# Patient Record
Sex: Male | Born: 1940 | Race: Black or African American | Hispanic: No | Marital: Married | Smoking: Former smoker
Health system: Southern US, Community
[De-identification: ages and names within clinical notes are randomized; demographics above are authoritative.]

## PROBLEM LIST (undated history)

## (undated) DIAGNOSIS — E1143 Type 2 diabetes mellitus with diabetic autonomic (poly)neuropathy: Secondary | ICD-10-CM

## (undated) DIAGNOSIS — I10 Essential (primary) hypertension: Secondary | ICD-10-CM

## (undated) DIAGNOSIS — E785 Hyperlipidemia, unspecified: Secondary | ICD-10-CM

## (undated) HISTORY — DX: Hyperlipidemia, unspecified: E78.5

## (undated) HISTORY — PX: HERNIA REPAIR: SHX51

## (undated) HISTORY — DX: Essential (primary) hypertension: I10

---
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
## 2009-01-11 ENCOUNTER — Ambulatory Visit (HOSPITAL_COMMUNITY): Admission: RE | Admit: 2009-01-11 | Discharge: 2009-01-11 | Payer: Self-pay | Admitting: Ophthalmology

## 2011-03-21 LAB — COMPREHENSIVE METABOLIC PANEL
ALT: 19 U/L (ref 0–53)
Alkaline Phosphatase: 40 U/L (ref 39–117)
CO2: 25 mEq/L (ref 19–32)
Chloride: 101 mEq/L (ref 96–112)
GFR calc non Af Amer: 30 mL/min — ABNORMAL LOW (ref >60)
Glucose, Bld: 144 mg/dL — ABNORMAL HIGH (ref 70–99)
Potassium: 3.9 mEq/L (ref 3.5–5.1)
Sodium: 135 mEq/L (ref 135–145)
Total Bilirubin: 0.5 mg/dL (ref 0.3–1.2)

## 2011-03-21 LAB — GLUCOSE, CAPILLARY: Glucose-Capillary: 155 mg/dL — ABNORMAL HIGH (ref 70–99)

## 2011-03-21 LAB — URINALYSIS, ROUTINE W REFLEX MICROSCOPIC
Bilirubin Urine: NEGATIVE
Ketones, ur: NEGATIVE mg/dL
Nitrite: NEGATIVE
Protein, ur: NEGATIVE mg/dL

## 2011-03-21 LAB — CBC
Hemoglobin: 11.4 g/dL — ABNORMAL LOW (ref 13.0–17.0)
MCHC: 34.1 g/dL (ref 30.0–36.0)
RBC: 3.71 MIL/uL — ABNORMAL LOW (ref 4.22–5.81)
WBC: 8.5 10*3/uL (ref 4.0–10.5)

## 2011-04-18 NOTE — Op Note (Signed)
NAME:  Christopher Wolfe, Christopher Wolfe                 ACCOUNT NO.:  [id number]   MEDICAL RECORD NO.:  [id number]          PATIENT TYPE:  AMB   LOCATION:  SDS                          FACILITY:  [facility]   PHYSICIAN:  Lanna Poche, M.D. 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   PROCEDURE:  Partial vitrectomy, peeling of internal limiting membrane,  and supplemental panretinal photocoagulation, right eye.   SURGEON:  Lanna Poche, MD   ANESTHESIA:  Local with monitored anesthesia care.   ESTIMATED BLOOD LOSS:  Less than 1 mL.   COMPLICATIONS:  None.   OPERATIVE NOTE:  The patient was taken the operating room.  After local  IV sedation, a retrobulbar injection of 50:50 mixture of 4% lidocaine  and 0.75% Marcaine was given around the right eye for volume of 8 mL.  Adequate akinesia and anesthesia was obtained.  The right eye was then  prepped and draped in the usual fashion.  Lid speculum was introduced  and conjunctival peritomy was developed  supratemporally and  superonasally.  Hemostasis was obtained with laser cautery and the  sclerotomies were fashioned 3 mm posterior limbus at 1:30, 10:30, and  7:30.  Superior sclerotomies were plugged and a 4-mm infusion cannula  was secured to the 7:30 sclerotomy with temporary sutures of 7-0 Vicryl.  The tip was visually inspected and found to be in good position.  Landers ring was secured to globe with a 7-0 Vicryl sutures at 3 and 9.  The plugs were removed, and 30-degree prismatic lens was applied on the  surface of eye.  Central core followed by peripheral vitrectomy was  performed.  There was posterior hyaloid separation.  Scleral depression  was used to further debulk the hemorrhagic inferior vitreous base.  Magnifying flat lens was then applied to the surface of eye.  Inspection  of macula showed the glistening surface of the retina with an evidence  of a small cyst in the center.  The internal membrane was then elected  to be peeled by making a small incision to the membrane superior,  proximal and distal away from the fovea.  The membrane was then elevated  off surface of the Rice pick and removed in 2 separate pieces with DORC  forceps.  The instruments from eyes and holes plugged.  The Landers lens  was removed.  Inspection with the ophthalmoscope and scleral depression  revealed there to be no peripheral retinal breaks or tears.  The  indirect laser was used to fill in small gaps in pan photocoagulation  primarily inferiorly.  Superior sclerotomies were then closed with 7-0  Vicryl.  Infusion cannula was removed and preplaced suture secured.  Conjunctiva was drawn and reapproximated with interrupted running  sutures of 6-0 plain gut.  Pressure checked with Barraquer tonometer and  found to lay at 21.  The subconjunctival space was irrigated with 0.75%  Marcaine, followed by subconjunctival injection of 100 mg  ceftazidime and 10 mg of Decadron.  Lid speculum was then removed and  mixed antibiotic ointment was applied to the surface of eye.  An eye  patch and shield were then placed over the patient's right eye, and the  patient left the operating room in stable condition.            ______________________________  Lanna Poche, M.D.     JTH/MEDQ  D:  01/11/2009  T:  01/12/2009  Job:  [id number]

## 2011-10-30 ENCOUNTER — Ambulatory Visit (INDEPENDENT_AMBULATORY_CARE_PROVIDER_SITE_OTHER): Payer: Medicare Other | Admitting: Ophthalmology

## 2011-10-30 DIAGNOSIS — H43819 Vitreous degeneration, unspecified eye: Secondary | ICD-10-CM

## 2011-10-30 DIAGNOSIS — E11359 Type 2 diabetes mellitus with proliferative diabetic retinopathy without macular edema: Secondary | ICD-10-CM

## 2011-10-30 DIAGNOSIS — E1139 Type 2 diabetes mellitus with other diabetic ophthalmic complication: Secondary | ICD-10-CM

## 2011-10-30 DIAGNOSIS — E1165 Type 2 diabetes mellitus with hyperglycemia: Secondary | ICD-10-CM

## 2012-04-30 ENCOUNTER — Ambulatory Visit (INDEPENDENT_AMBULATORY_CARE_PROVIDER_SITE_OTHER): Payer: Medicare Other | Admitting: Ophthalmology

## 2012-04-30 DIAGNOSIS — E1139 Type 2 diabetes mellitus with other diabetic ophthalmic complication: Secondary | ICD-10-CM

## 2012-04-30 DIAGNOSIS — E11359 Type 2 diabetes mellitus with proliferative diabetic retinopathy without macular edema: Secondary | ICD-10-CM

## 2012-04-30 DIAGNOSIS — H43819 Vitreous degeneration, unspecified eye: Secondary | ICD-10-CM

## 2012-04-30 DIAGNOSIS — H3581 Retinal edema: Secondary | ICD-10-CM

## 2012-09-02 ENCOUNTER — Ambulatory Visit (INDEPENDENT_AMBULATORY_CARE_PROVIDER_SITE_OTHER): Payer: Medicare Other | Admitting: Ophthalmology

## 2012-09-02 DIAGNOSIS — H35039 Hypertensive retinopathy, unspecified eye: Secondary | ICD-10-CM

## 2012-09-02 DIAGNOSIS — H43819 Vitreous degeneration, unspecified eye: Secondary | ICD-10-CM

## 2012-09-02 DIAGNOSIS — E11359 Type 2 diabetes mellitus with proliferative diabetic retinopathy without macular edema: Secondary | ICD-10-CM

## 2012-09-02 DIAGNOSIS — I10 Essential (primary) hypertension: Secondary | ICD-10-CM

## 2012-09-02 DIAGNOSIS — E1139 Type 2 diabetes mellitus with other diabetic ophthalmic complication: Secondary | ICD-10-CM

## 2012-09-12 ENCOUNTER — Encounter (INDEPENDENT_AMBULATORY_CARE_PROVIDER_SITE_OTHER): Payer: Medicare Other | Admitting: Ophthalmology

## 2012-09-12 DIAGNOSIS — H2700 Aphakia, unspecified eye: Secondary | ICD-10-CM

## 2013-03-13 ENCOUNTER — Ambulatory Visit (INDEPENDENT_AMBULATORY_CARE_PROVIDER_SITE_OTHER): Payer: Medicare Other | Admitting: Ophthalmology

## 2013-03-13 DIAGNOSIS — E1165 Type 2 diabetes mellitus with hyperglycemia: Secondary | ICD-10-CM

## 2013-03-13 DIAGNOSIS — H35039 Hypertensive retinopathy, unspecified eye: Secondary | ICD-10-CM

## 2013-03-13 DIAGNOSIS — E11359 Type 2 diabetes mellitus with proliferative diabetic retinopathy without macular edema: Secondary | ICD-10-CM

## 2013-03-13 DIAGNOSIS — I10 Essential (primary) hypertension: Secondary | ICD-10-CM

## 2013-03-13 DIAGNOSIS — H43819 Vitreous degeneration, unspecified eye: Secondary | ICD-10-CM

## 2013-03-13 DIAGNOSIS — E1139 Type 2 diabetes mellitus with other diabetic ophthalmic complication: Secondary | ICD-10-CM

## 2013-04-29 ENCOUNTER — Ambulatory Visit (INDEPENDENT_AMBULATORY_CARE_PROVIDER_SITE_OTHER): Payer: Medicare Other | Admitting: Podiatry

## 2013-04-29 ENCOUNTER — Encounter: Payer: Self-pay | Admitting: Podiatry

## 2013-04-29 DIAGNOSIS — B351 Tinea unguium: Secondary | ICD-10-CM | POA: Insufficient documentation

## 2013-04-29 DIAGNOSIS — M25579 Pain in unspecified ankle and joints of unspecified foot: Secondary | ICD-10-CM | POA: Insufficient documentation

## 2013-04-29 NOTE — Progress Notes (Signed)
Subjective: 72 y.o. year old male patient presents complaining of painful nails. Patient requests toe nails trimmed. Stated that his blood sugar level is ok.   Patient Summary List & History reviewed for allergies, medications, medical problems and surgical history.  Objective: Dermatologic:  Thick dystrophic nails x 10. No skin lesions. Vascular: Right DP and PT are not palpable.  Left DP and PT are palpable.  No edema or trophic changes. Orthopedic:  Contracted lesser digits bilateral. Neurologic:  Failed to respond to Monofilament sensory testing.  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   Treatment: All mycotic nails debrided.

## 2013-07-30 ENCOUNTER — Encounter: Payer: Self-pay | Admitting: Podiatry

## 2013-07-30 ENCOUNTER — Ambulatory Visit (INDEPENDENT_AMBULATORY_CARE_PROVIDER_SITE_OTHER): Payer: Medicare Other | Admitting: Podiatry

## 2013-07-30 VITALS — BP 141/60 | HR 58

## 2013-07-30 DIAGNOSIS — E1149 Type 2 diabetes mellitus with other diabetic neurological complication: Secondary | ICD-10-CM

## 2013-07-30 DIAGNOSIS — M25579 Pain in unspecified ankle and joints of unspecified foot: Secondary | ICD-10-CM

## 2013-07-30 DIAGNOSIS — B351 Tinea unguium: Secondary | ICD-10-CM

## 2013-07-30 NOTE — Progress Notes (Signed)
Subjective: 72 y.o. year old male patient presents complaining of painful nails. Patient requests toe nails trimmed. Reports no new problems.  Stated that blood sugar was 79 this morning before breakfast. Usually stays under 110.   Patient Summary List & History reviewed for allergies, medications, medical problems and surgical history.  Objective: Dermatologic:  Thick yellow and hypertrophic nails x 10. Vascular: Pedal pulses are all palpable. No edema or erythema noted. Orthopedic:  Rectus foot without gross deformities. Neurologic:  Gait normal. Reflexes normal and symmetric. Sensation grossly normal  Assessment: Dystrophic mycotic nails x 10.  Treatment: All mycotic nails debrided.  Return in 3 months or as needed.

## 2013-07-30 NOTE — Patient Instructions (Addendum)
Diabetic foot care and toe nails trimmed. Return as needed or in 3 months for palliative foot care.

## 2013-09-15 ENCOUNTER — Ambulatory Visit (INDEPENDENT_AMBULATORY_CARE_PROVIDER_SITE_OTHER): Payer: Medicare Other | Admitting: Ophthalmology

## 2013-09-15 DIAGNOSIS — E1139 Type 2 diabetes mellitus with other diabetic ophthalmic complication: Secondary | ICD-10-CM

## 2013-09-15 DIAGNOSIS — I10 Essential (primary) hypertension: Secondary | ICD-10-CM

## 2013-09-15 DIAGNOSIS — H35039 Hypertensive retinopathy, unspecified eye: Secondary | ICD-10-CM

## 2013-09-15 DIAGNOSIS — H43819 Vitreous degeneration, unspecified eye: Secondary | ICD-10-CM

## 2013-09-15 DIAGNOSIS — E11359 Type 2 diabetes mellitus with proliferative diabetic retinopathy without macular edema: Secondary | ICD-10-CM

## 2013-09-24 ENCOUNTER — Other Ambulatory Visit: Payer: Self-pay | Admitting: Gastroenterology

## 2013-09-24 ENCOUNTER — Ambulatory Visit
Admission: RE | Admit: 2013-09-24 | Discharge: 2013-09-24 | Disposition: A | Discharge status: Home / Self Care | Payer: Medicare Other | Source: Ambulatory Visit | Attending: Gastroenterology | Admitting: Gastroenterology

## 2013-09-24 DIAGNOSIS — Z9889 Other specified postprocedural states: Secondary | ICD-10-CM

## 2013-11-04 ENCOUNTER — Encounter: Payer: Self-pay | Admitting: Podiatry

## 2013-11-04 ENCOUNTER — Ambulatory Visit (INDEPENDENT_AMBULATORY_CARE_PROVIDER_SITE_OTHER): Payer: Medicare Other | Admitting: Podiatry

## 2013-11-04 VITALS — BP 119/73 | HR 60

## 2013-11-04 DIAGNOSIS — M25579 Pain in unspecified ankle and joints of unspecified foot: Secondary | ICD-10-CM

## 2013-11-04 DIAGNOSIS — B351 Tinea unguium: Secondary | ICD-10-CM

## 2013-11-04 NOTE — Progress Notes (Signed)
Subjective:  72 y.o. year old male patient presents complaining of painful nails. They are thick and hard, hurts in shoes.  Patient requests toe nails trimmed. Reports no new problems.  Stated that blood sugar runs fairly normal recently.   Objective: Dermatologic:  Thick yellow and hypertrophic nails x 10.  Vascular:  Pedal pulses are all palpable. No edema or erythema noted.  Orthopedic:  Severe contracted lesser digits bilateral. Neurologic:  All epicritic and tactile sensations are grossly normal  Assessment:  Dystrophic mycotic nails x 10.  Painful feet. Treatment: All mycotic nails debrided.  Return in 3 months or as needed.

## 2013-11-04 NOTE — Patient Instructions (Signed)
Seen for hypertrophic nails x 10. All nails debrided. Return in 3 months.

## 2014-02-03 ENCOUNTER — Encounter: Payer: Self-pay | Admitting: Podiatry

## 2014-02-03 ENCOUNTER — Ambulatory Visit (INDEPENDENT_AMBULATORY_CARE_PROVIDER_SITE_OTHER): Payer: Medicare Other | Admitting: Podiatry

## 2014-02-03 VITALS — BP 141/64 | HR 59

## 2014-02-03 DIAGNOSIS — M25579 Pain in unspecified ankle and joints of unspecified foot: Secondary | ICD-10-CM

## 2014-02-03 DIAGNOSIS — B351 Tinea unguium: Secondary | ICD-10-CM

## 2014-02-03 DIAGNOSIS — E119 Type 2 diabetes mellitus without complications: Secondary | ICD-10-CM

## 2014-02-03 DIAGNOSIS — M79606 Pain in leg, unspecified: Secondary | ICD-10-CM

## 2014-02-03 DIAGNOSIS — M79609 Pain in unspecified limb: Secondary | ICD-10-CM

## 2014-02-03 NOTE — Progress Notes (Signed)
Subjective:  73 y.o. year old male patient presents complaining of painful nails. They are thick and hard, hurts in shoes. Stated that blood sugar runs fairly normal recently.   Objective: Dermatologic:  Thick yellow and hypertrophic nails x 10.  Vascular:  Pedal pulses are not palpable during this visit. No edema or erythema noted.  No ischemic changes seen. Orthopedic:  Severe contracted lesser digits bilateral.  Neurologic:  All epicritic and tactile sensations are grossly normal.  Assessment:  Dystrophic mycotic nails x 10.  Painful feet.   Treatment: All mycotic nails debrided.  Return in 3 months or as needed.

## 2014-02-03 NOTE — Patient Instructions (Signed)
Seen for hypertrophic nails. All nails debrided. Return in 3 months or as needed.  

## 2014-03-20 ENCOUNTER — Ambulatory Visit (INDEPENDENT_AMBULATORY_CARE_PROVIDER_SITE_OTHER): Payer: Medicare Other | Admitting: Ophthalmology

## 2014-03-20 DIAGNOSIS — E1165 Type 2 diabetes mellitus with hyperglycemia: Secondary | ICD-10-CM

## 2014-03-20 DIAGNOSIS — H35039 Hypertensive retinopathy, unspecified eye: Secondary | ICD-10-CM

## 2014-03-20 DIAGNOSIS — I10 Essential (primary) hypertension: Secondary | ICD-10-CM

## 2014-03-20 DIAGNOSIS — E11359 Type 2 diabetes mellitus with proliferative diabetic retinopathy without macular edema: Secondary | ICD-10-CM

## 2014-03-20 DIAGNOSIS — E1139 Type 2 diabetes mellitus with other diabetic ophthalmic complication: Secondary | ICD-10-CM

## 2014-03-20 DIAGNOSIS — H43819 Vitreous degeneration, unspecified eye: Secondary | ICD-10-CM

## 2014-05-06 ENCOUNTER — Encounter: Payer: Self-pay | Admitting: Podiatry

## 2014-05-06 ENCOUNTER — Ambulatory Visit (INDEPENDENT_AMBULATORY_CARE_PROVIDER_SITE_OTHER): Payer: Medicare Other | Admitting: Podiatry

## 2014-05-06 VITALS — BP 159/69 | HR 55

## 2014-05-06 DIAGNOSIS — M79609 Pain in unspecified limb: Secondary | ICD-10-CM

## 2014-05-06 DIAGNOSIS — M79606 Pain in leg, unspecified: Secondary | ICD-10-CM

## 2014-05-06 DIAGNOSIS — B351 Tinea unguium: Secondary | ICD-10-CM

## 2014-05-06 NOTE — Progress Notes (Signed)
Subjective: 73 y.o. year old male patient presents complaining of painful nails. Patient requests toe nails trimmed. Patient is diabetic and his blood sugar is going up and down lately, ranging 120-140.  Objective: Dermatologic:  Thick discolored and dystrophic nails x 10. Vascular: Right pedal pulses are not palpable. Left foot DP is palpable.  Orthopedic:  Multiple digital contracture 2-5 R>L.  Neurologic:  All epicritic and tactile sensations grossly intact.   Assessment: Dystrophic mycotic nails x 10. NIDDM with fluctuating blood sugar.   Treatment: All mycotic nails debrided.  Return in 3 month or sooner as needed.

## 2014-05-06 NOTE — Patient Instructions (Signed)
Seen for hypertrophic nails. All nails debrided. Return in 3 months or as needed.  

## 2014-08-07 ENCOUNTER — Encounter: Payer: Self-pay | Admitting: Podiatry

## 2014-08-07 ENCOUNTER — Ambulatory Visit (INDEPENDENT_AMBULATORY_CARE_PROVIDER_SITE_OTHER): Payer: Medicare Other | Admitting: Podiatry

## 2014-08-07 VITALS — BP 153/57 | HR 59

## 2014-08-07 DIAGNOSIS — M79606 Pain in leg, unspecified: Secondary | ICD-10-CM

## 2014-08-07 DIAGNOSIS — B351 Tinea unguium: Secondary | ICD-10-CM

## 2014-08-07 DIAGNOSIS — M79609 Pain in unspecified limb: Secondary | ICD-10-CM

## 2014-08-07 NOTE — Patient Instructions (Signed)
Seen for hypertrophic nails. All nails debrided. Return in 3 months or as needed.  

## 2014-08-07 NOTE — Progress Notes (Signed)
Subjective:  73 y.o. year old male patient presents complaining of painful nails. Patient requests toe nails trimmed.   Objective: Dermatologic:  Thick discolored and dystrophic nails x 10.  Vascular:  Right pedal pulses are not palpable. Left foot DP is palpable.  Orthopedic:  Multiple digital contracture 2-5 R>L.  Neurologic:  All epicritic and tactile sensations grossly intact.   Assessment:  Dystrophic mycotic nails x 10.  NIDDM under control.    Plan: All nails debrided. Return in 3 months.

## 2014-09-23 ENCOUNTER — Ambulatory Visit (INDEPENDENT_AMBULATORY_CARE_PROVIDER_SITE_OTHER): Payer: Medicare Other | Admitting: Ophthalmology

## 2014-09-23 DIAGNOSIS — E11351 Type 2 diabetes mellitus with proliferative diabetic retinopathy with macular edema: Secondary | ICD-10-CM

## 2014-09-23 DIAGNOSIS — H43813 Vitreous degeneration, bilateral: Secondary | ICD-10-CM

## 2014-09-23 DIAGNOSIS — H35372 Puckering of macula, left eye: Secondary | ICD-10-CM

## 2014-09-23 DIAGNOSIS — E11311 Type 2 diabetes mellitus with unspecified diabetic retinopathy with macular edema: Secondary | ICD-10-CM

## 2014-09-23 DIAGNOSIS — I10 Essential (primary) hypertension: Secondary | ICD-10-CM

## 2014-11-03 ENCOUNTER — Ambulatory Visit (INDEPENDENT_AMBULATORY_CARE_PROVIDER_SITE_OTHER): Payer: Medicare Other | Admitting: Podiatry

## 2014-11-03 ENCOUNTER — Encounter: Payer: Self-pay | Admitting: Podiatry

## 2014-11-03 VITALS — BP 165/62 | HR 59 | Ht 74.0 in | Wt 225.0 lb

## 2014-11-03 DIAGNOSIS — M79606 Pain in leg, unspecified: Secondary | ICD-10-CM

## 2014-11-03 DIAGNOSIS — B351 Tinea unguium: Secondary | ICD-10-CM

## 2014-11-03 NOTE — Patient Instructions (Signed)
Seen for hypertrophic nails. All nails debrided. Return in 3 months or as needed.  

## 2014-11-03 NOTE — Progress Notes (Signed)
Subjective:  73 y.o. year old male patient presents complaining of painful nails. Patient requests toe nails trimmed.  No new problems reported. Blood sugar is under control. Last three mornings read between 105-107. Blood pressure is up just now, was normal at home.   Objective: Dermatologic:  Thick discolored and dystrophic nails x 10.  Vascular:  Right pedal pulses are not palpable. Left foot DP is palpable.  Orthopedic:  Multiple digital contracture 2-5 R>L.  Neurologic:  All epicritic and tactile sensations grossly intact.   Assessment:  Dystrophic mycotic nails x 10.  NIDDM under control.   Plan: All nails debrided. Return in 3 months.

## 2014-11-06 ENCOUNTER — Ambulatory Visit: Payer: Medicare Other | Admitting: Podiatry

## 2015-02-02 ENCOUNTER — Encounter: Payer: Self-pay | Admitting: Podiatry

## 2015-02-02 ENCOUNTER — Ambulatory Visit (INDEPENDENT_AMBULATORY_CARE_PROVIDER_SITE_OTHER): Payer: Medicare Other | Admitting: Podiatry

## 2015-02-02 VITALS — BP 153/56 | HR 59

## 2015-02-02 DIAGNOSIS — M79606 Pain in leg, unspecified: Secondary | ICD-10-CM

## 2015-02-02 DIAGNOSIS — B351 Tinea unguium: Secondary | ICD-10-CM | POA: Diagnosis not present

## 2015-02-02 NOTE — Patient Instructions (Signed)
Seen for hypertrophic nails. All nails debrided. Return in 3 months or as needed.  

## 2015-02-02 NOTE — Progress Notes (Signed)
Subjective:  74 y.o. year old male patient presents complaining of painful nails. Patient requests toe nails trimmed.   Objective: Dermatologic:  Thick discolored and dystrophic nails x 10.  Vascular:  Right pedal pulses are not palpable. Left foot DP is palpable.  Orthopedic:  Multiple digital contracture 2-5 R>L.  Neurologic:  All epicritic and tactile sensations grossly intact.   Assessment:  Dystrophic mycotic nails x 10.  NIDDM under control.   Plan: All nails debrided. Return in 3 months.

## 2015-03-29 ENCOUNTER — Ambulatory Visit (INDEPENDENT_AMBULATORY_CARE_PROVIDER_SITE_OTHER): Payer: Medicare Other | Admitting: Ophthalmology

## 2015-03-29 DIAGNOSIS — E11359 Type 2 diabetes mellitus with proliferative diabetic retinopathy without macular edema: Secondary | ICD-10-CM | POA: Diagnosis not present

## 2015-03-29 DIAGNOSIS — E11319 Type 2 diabetes mellitus with unspecified diabetic retinopathy without macular edema: Secondary | ICD-10-CM

## 2015-03-29 DIAGNOSIS — H35372 Puckering of macula, left eye: Secondary | ICD-10-CM | POA: Diagnosis not present

## 2015-03-29 DIAGNOSIS — I10 Essential (primary) hypertension: Secondary | ICD-10-CM | POA: Diagnosis not present

## 2015-03-29 DIAGNOSIS — H35033 Hypertensive retinopathy, bilateral: Secondary | ICD-10-CM | POA: Diagnosis not present

## 2015-03-29 DIAGNOSIS — H43813 Vitreous degeneration, bilateral: Secondary | ICD-10-CM

## 2015-05-05 ENCOUNTER — Encounter: Payer: Self-pay | Admitting: Podiatry

## 2015-05-05 ENCOUNTER — Ambulatory Visit (INDEPENDENT_AMBULATORY_CARE_PROVIDER_SITE_OTHER): Payer: Medicare Other | Admitting: Podiatry

## 2015-05-05 VITALS — BP 160/63 | HR 58

## 2015-05-05 DIAGNOSIS — B351 Tinea unguium: Secondary | ICD-10-CM

## 2015-05-05 DIAGNOSIS — M79606 Pain in leg, unspecified: Secondary | ICD-10-CM

## 2015-05-05 NOTE — Progress Notes (Signed)
Subjective:  74 y.o. year old male patient presents complaining of painful nails. Patient requests toe nails trimmed.  No new problems.   Objective: Dermatologic:  Thick discolored and dystrophic nails x 10.  Vascular:  Right pedal pulses are not palpable. Left foot DP is palpable.  Orthopedic:  Multiple digital contracture 2-5 R>L.  Neurologic:  All epicritic and tactile sensations grossly intact.   Assessment:  Dystrophic mycotic nails x 10.  NIDDM under control.   Plan: All nails debrided. Return in 3 months.

## 2015-05-05 NOTE — Patient Instructions (Signed)
Seen for hypertrophic nails. All nails debrided. Return in 3 months or as needed.  

## 2015-08-06 ENCOUNTER — Encounter: Payer: Self-pay | Admitting: Podiatry

## 2015-08-06 ENCOUNTER — Ambulatory Visit (INDEPENDENT_AMBULATORY_CARE_PROVIDER_SITE_OTHER): Payer: Medicare Other | Admitting: Podiatry

## 2015-08-06 VITALS — BP 175/63 | HR 67

## 2015-08-06 DIAGNOSIS — M79606 Pain in leg, unspecified: Secondary | ICD-10-CM | POA: Diagnosis not present

## 2015-08-06 DIAGNOSIS — B351 Tinea unguium: Secondary | ICD-10-CM

## 2015-08-06 NOTE — Patient Instructions (Signed)
Seen for hypertrophic nails. All nails debrided. Return in 3 months or as needed.  

## 2015-08-06 NOTE — Progress Notes (Signed)
Subjective:  74 y.o. year old male patient presents complaining of painful nails. Patient requests toe nails trimmed.  Denies any new problems.   Objective: Dermatologic:  Thick discolored and dystrophic nails x 10.  Vascular:  Right pedal pulses are not palpable. Left foot DP is palpable.  Orthopedic:  Multiple digital contracture 2-5 R>L.  Neurologic:  All epicritic and tactile sensations grossly intact.   Assessment:  Dystrophic mycotic nails x 10.  NIDDM under control.   Plan: All nails debrided. Return in 3 months or as needed.

## 2015-09-28 ENCOUNTER — Ambulatory Visit (INDEPENDENT_AMBULATORY_CARE_PROVIDER_SITE_OTHER): Payer: Medicare Other | Admitting: Ophthalmology

## 2015-09-28 DIAGNOSIS — E11311 Type 2 diabetes mellitus with unspecified diabetic retinopathy with macular edema: Secondary | ICD-10-CM | POA: Diagnosis not present

## 2015-09-28 DIAGNOSIS — H35033 Hypertensive retinopathy, bilateral: Secondary | ICD-10-CM

## 2015-09-28 DIAGNOSIS — H43813 Vitreous degeneration, bilateral: Secondary | ICD-10-CM | POA: Diagnosis not present

## 2015-09-28 DIAGNOSIS — I10 Essential (primary) hypertension: Secondary | ICD-10-CM

## 2015-09-28 DIAGNOSIS — E113512 Type 2 diabetes mellitus with proliferative diabetic retinopathy with macular edema, left eye: Secondary | ICD-10-CM | POA: Diagnosis not present

## 2015-09-28 DIAGNOSIS — E113591 Type 2 diabetes mellitus with proliferative diabetic retinopathy without macular edema, right eye: Secondary | ICD-10-CM

## 2015-10-13 ENCOUNTER — Other Ambulatory Visit (INDEPENDENT_AMBULATORY_CARE_PROVIDER_SITE_OTHER): Payer: Medicare Other | Admitting: Ophthalmology

## 2015-10-13 DIAGNOSIS — H26492 Other secondary cataract, left eye: Secondary | ICD-10-CM | POA: Diagnosis not present

## 2015-10-21 ENCOUNTER — Ambulatory Visit (INDEPENDENT_AMBULATORY_CARE_PROVIDER_SITE_OTHER): Payer: Medicare Other | Admitting: Ophthalmology

## 2015-10-26 ENCOUNTER — Ambulatory Visit (INDEPENDENT_AMBULATORY_CARE_PROVIDER_SITE_OTHER): Payer: Medicare Other | Admitting: Ophthalmology

## 2015-10-26 DIAGNOSIS — H2702 Aphakia, left eye: Secondary | ICD-10-CM

## 2015-10-27 ENCOUNTER — Ambulatory Visit (INDEPENDENT_AMBULATORY_CARE_PROVIDER_SITE_OTHER): Payer: Medicare Other | Admitting: Ophthalmology

## 2015-11-04 ENCOUNTER — Encounter: Payer: Self-pay | Admitting: Podiatry

## 2015-11-04 ENCOUNTER — Ambulatory Visit (INDEPENDENT_AMBULATORY_CARE_PROVIDER_SITE_OTHER): Payer: Medicare Other | Admitting: Podiatry

## 2015-11-04 VITALS — BP 160/57 | HR 51

## 2015-11-04 DIAGNOSIS — M79606 Pain in leg, unspecified: Secondary | ICD-10-CM

## 2015-11-04 DIAGNOSIS — B351 Tinea unguium: Secondary | ICD-10-CM | POA: Diagnosis not present

## 2015-11-04 NOTE — Progress Notes (Signed)
Subjective:  74 y.o. year old male patient presents complaining of painful nails. Patient requests toe nails trimmed.  Denies any new problems.   Objective: Dermatologic:  Thick discolored and dystrophic nails x 10.  Deformed thick nail with dry blood at distal end under callused skin 3rd right.  Vascular:  Right pedal pulses are not palpable. Left foot DP is palpable.  Orthopedic:  Multiple digital contracture 2-5 R>L.  Neurologic:  All epicritic and tactile sensations grossly intact.   Assessment:  Dystrophic mycotic nails x 10.  NIDDM under control.   Plan: All nails debrided. Return in 3 months or as needed.

## 2015-11-04 NOTE — Patient Instructions (Signed)
Seen for hypertrophic nails. All nails debrided. Return in 3 months or as needed.  

## 2015-11-05 ENCOUNTER — Ambulatory Visit: Payer: Medicare Other | Admitting: Podiatry

## 2016-02-02 ENCOUNTER — Ambulatory Visit: Payer: Medicare Other | Admitting: Podiatry

## 2016-02-09 ENCOUNTER — Encounter: Payer: Self-pay | Admitting: Podiatry

## 2016-02-09 ENCOUNTER — Ambulatory Visit (INDEPENDENT_AMBULATORY_CARE_PROVIDER_SITE_OTHER): Payer: Medicare Other | Admitting: Podiatry

## 2016-02-09 VITALS — BP 151/57 | HR 55

## 2016-02-09 DIAGNOSIS — M79606 Pain in leg, unspecified: Secondary | ICD-10-CM

## 2016-02-09 DIAGNOSIS — B351 Tinea unguium: Secondary | ICD-10-CM | POA: Diagnosis not present

## 2016-02-09 NOTE — Progress Notes (Signed)
Subjective:  75 y.o. year old male patient presents complaining of painful nails. Patient requests toe nails trimmed.  Denies any new problems.   Objective: Dermatologic:  Thick discolored and dystrophic nails x 10.  Deformed thick nail with dry blood at distal end under callused skin 3rd right.  Vascular:  Right pedal pulses are not palpable. Left foot DP is palpable. Orthopedic:  Multiple digital contracture 2-5 R>L.  Neurologic:  All epicritic and tactile sensations grossly intact.   Assessment:  Dystrophic mycotic nails x 10.  NIDDM under control.   Plan: All nails debrided. Bleeding nails cauterized with silver nitrate sticks. Return in 3 months or as needed.

## 2016-02-09 NOTE — Patient Instructions (Signed)
Seen for hypertrophic nails. All nails debrided. Return in 3 months or as needed.  

## 2016-04-24 ENCOUNTER — Ambulatory Visit (INDEPENDENT_AMBULATORY_CARE_PROVIDER_SITE_OTHER): Payer: Medicare Other | Admitting: Ophthalmology

## 2016-04-24 DIAGNOSIS — E11319 Type 2 diabetes mellitus with unspecified diabetic retinopathy without macular edema: Secondary | ICD-10-CM | POA: Diagnosis not present

## 2016-04-24 DIAGNOSIS — E113593 Type 2 diabetes mellitus with proliferative diabetic retinopathy without macular edema, bilateral: Secondary | ICD-10-CM | POA: Diagnosis not present

## 2016-04-24 DIAGNOSIS — H35033 Hypertensive retinopathy, bilateral: Secondary | ICD-10-CM

## 2016-04-24 DIAGNOSIS — I10 Essential (primary) hypertension: Secondary | ICD-10-CM

## 2016-04-24 DIAGNOSIS — H43813 Vitreous degeneration, bilateral: Secondary | ICD-10-CM | POA: Diagnosis not present

## 2016-05-11 ENCOUNTER — Encounter: Payer: Self-pay | Admitting: Podiatry

## 2016-05-11 ENCOUNTER — Ambulatory Visit (INDEPENDENT_AMBULATORY_CARE_PROVIDER_SITE_OTHER): Payer: Medicare Other | Admitting: Podiatry

## 2016-05-11 VITALS — BP 165/60 | HR 60

## 2016-05-11 DIAGNOSIS — B351 Tinea unguium: Secondary | ICD-10-CM

## 2016-05-11 DIAGNOSIS — M79606 Pain in leg, unspecified: Secondary | ICD-10-CM | POA: Diagnosis not present

## 2016-05-11 NOTE — Patient Instructions (Signed)
Seen for hypertrophic nails. All nails corns debrided. Bleeding area cleansed and Amerigel compression dressing applied. Keep it covered for 2 days.  Return in 3 months or as needed.

## 2016-05-11 NOTE — Progress Notes (Signed)
Subjective:  75 y.o. year old male patient presents complaining of painful nails. Patient requests toe nails trimmed.  Denies any new problems.   Objective: Dermatologic:  Thick discolored and dystrophic nails x 10.  Deformed thick nail with dry blood at distal end under callused skin 3rd right.  Vascular:  Right pedal pulses are not palpable. Left foot DP is palpable. Orthopedic:  Multiple digital contracture 2-5 R>L.  Neurologic:  All epicritic and tactile sensations grossly intact.   Assessment:  Dystrophic mycotic nails x 10.  NIDDM under control.   Plan: All nails debrided. Bleeding nails cauterized with silver nitrate sticks. Return in 3 months or as needed.

## 2016-08-15 ENCOUNTER — Ambulatory Visit (INDEPENDENT_AMBULATORY_CARE_PROVIDER_SITE_OTHER): Payer: Medicare Other | Admitting: Podiatry

## 2016-08-15 ENCOUNTER — Encounter: Payer: Self-pay | Admitting: Podiatry

## 2016-08-15 DIAGNOSIS — B351 Tinea unguium: Secondary | ICD-10-CM

## 2016-08-15 DIAGNOSIS — M79606 Pain in leg, unspecified: Secondary | ICD-10-CM

## 2016-08-15 DIAGNOSIS — M79673 Pain in unspecified foot: Secondary | ICD-10-CM | POA: Diagnosis not present

## 2016-08-15 NOTE — Patient Instructions (Signed)
Seen for hypertrophic nails and calluses All nails debrided. Thick bled callus 3rd digit right debrided. Return in 3 months or sooner if needed.

## 2016-08-15 NOTE — Progress Notes (Signed)
Subjective:  75 y.o. year old male patient presents complaining of painful nails and calluses. Patient requests toe nails trimmed.   Objective: Dermatologic:  Thick discolored and dystrophic nails x 10.  Deformed thick nail with dry blood at distal end under callused skin 3rd right.  Vascular:  Right pedal pulses are not palpable. Left foot DP is palpable. Orthopedic:  Multiple digital contracture 2-5 R>L.  Neurologic:  All epicritic and tactile sensations grossly intact.   Assessment:  Dystrophic mycotic nails x 10.  NIDDM under control.   Plan: All nails debrided. Return in 3 months or as needed.

## 2016-10-25 ENCOUNTER — Ambulatory Visit (INDEPENDENT_AMBULATORY_CARE_PROVIDER_SITE_OTHER): Payer: Medicare Other | Admitting: Ophthalmology

## 2016-10-25 DIAGNOSIS — E11311 Type 2 diabetes mellitus with unspecified diabetic retinopathy with macular edema: Secondary | ICD-10-CM

## 2016-10-25 DIAGNOSIS — E113513 Type 2 diabetes mellitus with proliferative diabetic retinopathy with macular edema, bilateral: Secondary | ICD-10-CM

## 2016-10-25 DIAGNOSIS — H35033 Hypertensive retinopathy, bilateral: Secondary | ICD-10-CM

## 2016-10-25 DIAGNOSIS — H43813 Vitreous degeneration, bilateral: Secondary | ICD-10-CM | POA: Diagnosis not present

## 2016-10-25 DIAGNOSIS — I10 Essential (primary) hypertension: Secondary | ICD-10-CM

## 2016-11-14 ENCOUNTER — Encounter: Payer: Self-pay | Admitting: Podiatry

## 2016-11-14 ENCOUNTER — Ambulatory Visit (INDEPENDENT_AMBULATORY_CARE_PROVIDER_SITE_OTHER): Payer: Medicare Other | Admitting: Podiatry

## 2016-11-14 DIAGNOSIS — B351 Tinea unguium: Secondary | ICD-10-CM | POA: Diagnosis not present

## 2016-11-14 DIAGNOSIS — M79673 Pain in unspecified foot: Secondary | ICD-10-CM | POA: Diagnosis not present

## 2016-11-14 DIAGNOSIS — L97511 Non-pressure chronic ulcer of other part of right foot limited to breakdown of skin: Secondary | ICD-10-CM | POA: Diagnosis not present

## 2016-11-14 NOTE — Progress Notes (Signed)
Subjective: 75 y.o. year old male patient presents complaining of painful nails and calluses. Patient requests toe nails trimmed.  Blood sugar is under control stays in 90's.  Objective: Dermatologic: Thick discolored and dystrophic nails x 10.  Deformed thick nail with ulcerated callused skin 3rd digit at distal end right.  Vascular: Right pedal pulses are not palpable. Left foot DP is palpable. Orthopedic: Multiple digital contracture 2-5 R>L.  Neurologic: All epicritic and tactile sensations grossly intact.   Assessment: Dystrophic mycotic nails x 10.  Ulcer 3rd digit right. NIDDM under control.   Plan: All nails debrided. Ulcer debrided and Amerigel dressing applied. Buttress pad placed under 2nd and 3rd digit right. Return in 3 weeks for follow up on 3rd digit right.

## 2016-11-14 NOTE — Patient Instructions (Addendum)
Seen for hypertrophic nails. Noted of ulcerating corn at distal end 3rd digit right. All nails and ulcerating corn debrided. Covered with Amerigel dressing and placed Buttress pad under 2nd and 3rd digit right. Keep the 2nd toe lesion covered after each shower. Use buttress pad while ambulating. Return in 3 weeks.

## 2016-12-05 ENCOUNTER — Ambulatory Visit (INDEPENDENT_AMBULATORY_CARE_PROVIDER_SITE_OTHER): Payer: Medicare Other | Admitting: Podiatry

## 2016-12-05 ENCOUNTER — Encounter: Payer: Self-pay | Admitting: Podiatry

## 2016-12-05 DIAGNOSIS — L97511 Non-pressure chronic ulcer of other part of right foot limited to breakdown of skin: Secondary | ICD-10-CM | POA: Diagnosis not present

## 2016-12-05 DIAGNOSIS — M79671 Pain in right foot: Secondary | ICD-10-CM | POA: Diagnosis not present

## 2016-12-05 DIAGNOSIS — M2041 Other hammer toe(s) (acquired), right foot: Secondary | ICD-10-CM | POA: Diagnosis not present

## 2016-12-05 DIAGNOSIS — M79673 Pain in unspecified foot: Secondary | ICD-10-CM

## 2016-12-05 DIAGNOSIS — M79672 Pain in left foot: Secondary | ICD-10-CM | POA: Diagnosis not present

## 2016-12-05 NOTE — Patient Instructions (Signed)
Seen for hypertrophic nails. Noted of ulcerating corn on 3rd digit right. Debrided and padded with buttress pad. All nails debrided. Return in 1 month for follow up on right 3rd toe ulcer.

## 2016-12-05 NOTE — Progress Notes (Signed)
Subjective: 76y.o. year old male patient presents complaining of painful nails and calluses. Patient requests toe nails trimmed.  Blood sugar is under control.  Objective: Dermatologic: Thick discolored and dystrophic nails x 10.  Ulcerated callused skin 3rd digit at distal end right.  Vascular: Right pedal pulses are not palpable. Left foot DP is palpable.  Orthopedic: Multiple digital contracture 2-5 R>L.  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 Neurologic: All epicritic and tactile sensations grossly intact.   Assessment: Dystrophic mycotic nails x 10.  Ulcer 3rd digit right. NIDDM under control.   Plan: Debrided ulcerated callus and dressing applied. All nails debrided. Reviewed possible surgical intervention to prevent on going ulceration.  Amerigel dressing applied. Buttress pad placed under 3rd digit right. Return in 1 month follow up on 3rd digit right.

## 2017-01-04 ENCOUNTER — Ambulatory Visit: Payer: Medicare Other | Admitting: Podiatry

## 2017-01-09 ENCOUNTER — Ambulatory Visit (INDEPENDENT_AMBULATORY_CARE_PROVIDER_SITE_OTHER): Payer: Medicare Other | Admitting: Podiatry

## 2017-01-09 DIAGNOSIS — M79672 Pain in left foot: Secondary | ICD-10-CM

## 2017-01-09 DIAGNOSIS — M79671 Pain in right foot: Secondary | ICD-10-CM | POA: Diagnosis not present

## 2017-01-09 DIAGNOSIS — L97511 Non-pressure chronic ulcer of other part of right foot limited to breakdown of skin: Secondary | ICD-10-CM

## 2017-01-09 DIAGNOSIS — M2041 Other hammer toe(s) (acquired), right foot: Secondary | ICD-10-CM

## 2017-01-09 DIAGNOSIS — B351 Tinea unguium: Secondary | ICD-10-CM

## 2017-01-09 NOTE — Progress Notes (Signed)
Subjective: 76y.o. year old male patient presents complaining of painful nails and calluses. Patient requests toe nails trimmed.  Blood sugar is under control.   Objective: Dermatologic: Thick discolored and dystrophic nails x 10.  Ulceratedcallused skin 3rd digit at distal end with raw tissue exposure right.  Vascular: Right pedal pulses are not palpable. Left foot DP is palpable.  Orthopedic: Multiple digital contracture 2-5 R>L.  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 Neurologic: All epicritic and tactile sensations grossly intact.   Assessment: Dystrophic mycotic nails x 10.  Ulcer 3rd digit right. NIDDM under control.   Plan: Debrided ulcerated callus and dressing applied. All nails debrided. Reviewed possible surgical intervention to prevent on going ulceration.  Amerigel dressing applied. Buttress pad placed under 3rd digit right. Return in 1 week for follow up on 3rd digit right.

## 2017-01-09 NOTE — Patient Instructions (Signed)
Seen for painful toe. All nails are hypertrophic.  3rd digit right foot has ulcerated lesion at distal end. Nail and infected tissue removed and dressing applied to 3rd digit right. Need daily soaking in Epson salt water and re dress with antibiotic ointment and gauze. All other nails debrided. Return in 1 week.

## 2017-01-10 ENCOUNTER — Encounter: Payer: Self-pay | Admitting: Podiatry

## 2017-01-16 ENCOUNTER — Ambulatory Visit: Payer: Medicare Other | Admitting: Podiatry

## 2017-01-17 ENCOUNTER — Ambulatory Visit (INDEPENDENT_AMBULATORY_CARE_PROVIDER_SITE_OTHER): Payer: Medicare Other | Admitting: Podiatry

## 2017-01-17 ENCOUNTER — Encounter: Payer: Self-pay | Admitting: Podiatry

## 2017-01-17 DIAGNOSIS — L97511 Non-pressure chronic ulcer of other part of right foot limited to breakdown of skin: Secondary | ICD-10-CM

## 2017-01-17 DIAGNOSIS — M2041 Other hammer toe(s) (acquired), right foot: Secondary | ICD-10-CM | POA: Diagnosis not present

## 2017-01-17 DIAGNOSIS — M79671 Pain in right foot: Secondary | ICD-10-CM

## 2017-01-17 NOTE — Progress Notes (Signed)
Subjective: 76y.o. year old male patient presents for follow up on right 3rd toe ulcer. Blood sugar is under control.   Objective: Dermatologic: Ulcer surrounded by thickcallused skin 3rd digit at distal end, 0.8 cm in diameter with raw tissue exposure right. No active drainage noted. Vascular: Right pedal pulses are not palpable. Left foot DP is palpable.  Orthopedic: Multiple digital contracture 2-5 R>L.  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 Neurologic: All epicritic and tactile sensations grossly intact.   Assessment: Ulcer at distal end 3rd digit right no change since the last visit. NIDDM under control.   Plan: Debrided ulcerated callus and dressing applied. Amerigel dressing applied. Buttress pad placed under 2nd and 3rd digit right. Replacement pad dispensed. Return in 1 week forfollow up on 3rd digit right.

## 2017-01-17 NOTE — Patient Instructions (Signed)
Follow up on ulcer 3rd digit right. Still has opening that has not improved since last visit. Need be off of feet more. Debrided and padded. Keep the pad during the day and remove at night. Return in one week.

## 2017-01-18 ENCOUNTER — Encounter (INDEPENDENT_AMBULATORY_CARE_PROVIDER_SITE_OTHER): Payer: Medicare Other | Admitting: Ophthalmology

## 2017-01-18 DIAGNOSIS — E113591 Type 2 diabetes mellitus with proliferative diabetic retinopathy without macular edema, right eye: Secondary | ICD-10-CM | POA: Diagnosis not present

## 2017-01-18 DIAGNOSIS — E113512 Type 2 diabetes mellitus with proliferative diabetic retinopathy with macular edema, left eye: Secondary | ICD-10-CM | POA: Diagnosis not present

## 2017-01-18 DIAGNOSIS — E11311 Type 2 diabetes mellitus with unspecified diabetic retinopathy with macular edema: Secondary | ICD-10-CM | POA: Diagnosis not present

## 2017-01-18 DIAGNOSIS — H43813 Vitreous degeneration, bilateral: Secondary | ICD-10-CM

## 2017-01-18 DIAGNOSIS — I10 Essential (primary) hypertension: Secondary | ICD-10-CM | POA: Diagnosis not present

## 2017-01-18 DIAGNOSIS — H35033 Hypertensive retinopathy, bilateral: Secondary | ICD-10-CM

## 2017-01-24 ENCOUNTER — Encounter: Payer: Self-pay | Admitting: Podiatry

## 2017-01-24 ENCOUNTER — Ambulatory Visit (INDEPENDENT_AMBULATORY_CARE_PROVIDER_SITE_OTHER): Payer: Medicare Other | Admitting: Podiatry

## 2017-01-24 DIAGNOSIS — L97511 Non-pressure chronic ulcer of other part of right foot limited to breakdown of skin: Secondary | ICD-10-CM

## 2017-01-24 DIAGNOSIS — M79671 Pain in right foot: Secondary | ICD-10-CM

## 2017-01-24 DIAGNOSIS — M2041 Other hammer toe(s) (acquired), right foot: Secondary | ICD-10-CM | POA: Diagnosis not present

## 2017-01-24 NOTE — Patient Instructions (Signed)
Follow up on right foot ulcer. Noted that still much pressure to the toe from weight bearing. Debrided, padded, placed in surgical shoe. Stay off of feet and wear surgical shoe to get around. Return in one week.

## 2017-01-24 NOTE — Progress Notes (Signed)
Subjective: 76y.o. year old male patient presents for follow up on right 3rd toe ulcer. Blood sugar is under control.   Objective: Dermatologic: Ulcer surrounded by thickcallused skin 3rd digit at distal end, 0.8 cm in diameter with raw tissue exposure right. No active drainage noted. Vascular: Right pedal pulses are not palpable. Left foot DP is palpable.  Orthopedic: Multiple digital contracture 2-5 R>L.  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 Neurologic: All epicritic and tactile sensations grossly intact.   Assessment: Ulcer at distal end 3rd digit right, slow improvement, 0.5 cm diameter in open center. NIDDM under control.   Plan: Debrided ulcerated callus and dressing applied. Amerigel dressing applied. Removable pad placed under 3rd digit right.  Placed in surgical shoe. Return in 1 week forfollow up on 3rd digit right.

## 2017-01-31 ENCOUNTER — Ambulatory Visit (INDEPENDENT_AMBULATORY_CARE_PROVIDER_SITE_OTHER): Payer: Medicare Other | Admitting: Podiatry

## 2017-01-31 ENCOUNTER — Encounter: Payer: Self-pay | Admitting: Podiatry

## 2017-01-31 DIAGNOSIS — M2041 Other hammer toe(s) (acquired), right foot: Secondary | ICD-10-CM | POA: Diagnosis not present

## 2017-01-31 DIAGNOSIS — I739 Peripheral vascular disease, unspecified: Secondary | ICD-10-CM | POA: Diagnosis not present

## 2017-01-31 DIAGNOSIS — L97511 Non-pressure chronic ulcer of other part of right foot limited to breakdown of skin: Secondary | ICD-10-CM | POA: Diagnosis not present

## 2017-01-31 NOTE — Progress Notes (Signed)
Subjective: 76y.o. year old male patient presents for 1 week follow up on right 3rd toe ulcer. Patient is accompanied by his wife. Patient made a new claim during this visit saying that the practitioner made a cut and cause the sore toe.Marland Kitchen.  He is on surgical shoe wearing old removable pad that was dispensed last week.  Stated that he has been off of feet more than before and changed dressing every other day.  Objective: Dermatologic: Ulcer site is clean without further callus build up.  The lesion has improved with superficial open base, 0.2 cm at distal end.  No active drainage noted. Vascular: Right pedal pulses are not palpable. Left foot DP is palpable.  Mild forefoot edema present at lateral aspect. Orthopedic: Multiple digital contracture 2-5 R>L.  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 Neurologic: All epicritic and tactile sensations grossly intact.   Assessment: Ulcer at distal end 3rd digit right made big improvement, down to 0.2 cm superficial open base without callus build up. Compromised peripheral vascularity right. NIDDM under control.   Plan: 3rd toe lesion cleansed with Iodine and Amerigel dressing applied. New removable pad placed under 3rd digit right with instruction.  Instructed to keep the pad during the day. May remove the pad at night to aerate more. Informed to wife, when the lesion was found and what was done for the last 3 weeks to resolve the pressure ulcer that was caused by contracted digit. Christopher Wolfe seems to understand the explannation.  [name] insisted that he did not cause the lesion but cause during the office when he came in to have toe nails trimmed. Explained to both the lesion is almost healed. It may heal off in 2 weeks if he continue to stay off and take load off of feet. Advised to return in 2 weeks for final follow up on the lesion. [name] refused to take advice to return and walked  out without making appointment.  Will send him a letter of recommendation, to seek medical care in next 2 weeks to be sure the toe is completely healed.

## 2017-01-31 NOTE — Patient Instructions (Signed)
Follow up right foot ulcer. Noted of much improved ulcer site 3rd toe right. Lesion cleansed and dressing re-aplied with Amerigel ointment. Keep it covered during the day. May leave it open at night. Return in two weeks.

## 2017-07-20 ENCOUNTER — Ambulatory Visit (INDEPENDENT_AMBULATORY_CARE_PROVIDER_SITE_OTHER): Payer: Medicare Other | Admitting: Ophthalmology

## 2017-07-27 ENCOUNTER — Ambulatory Visit (INDEPENDENT_AMBULATORY_CARE_PROVIDER_SITE_OTHER): Payer: Medicare Other | Admitting: Ophthalmology

## 2017-07-27 DIAGNOSIS — E113512 Type 2 diabetes mellitus with proliferative diabetic retinopathy with macular edema, left eye: Secondary | ICD-10-CM

## 2017-07-27 DIAGNOSIS — I10 Essential (primary) hypertension: Secondary | ICD-10-CM | POA: Diagnosis not present

## 2017-07-27 DIAGNOSIS — E113591 Type 2 diabetes mellitus with proliferative diabetic retinopathy without macular edema, right eye: Secondary | ICD-10-CM | POA: Diagnosis not present

## 2017-07-27 DIAGNOSIS — E11319 Type 2 diabetes mellitus with unspecified diabetic retinopathy without macular edema: Secondary | ICD-10-CM

## 2017-07-27 DIAGNOSIS — H35033 Hypertensive retinopathy, bilateral: Secondary | ICD-10-CM

## 2017-07-27 DIAGNOSIS — H43813 Vitreous degeneration, bilateral: Secondary | ICD-10-CM | POA: Diagnosis not present

## 2018-02-06 ENCOUNTER — Encounter (INDEPENDENT_AMBULATORY_CARE_PROVIDER_SITE_OTHER): Payer: Medicare Other | Admitting: Ophthalmology

## 2018-02-06 DIAGNOSIS — H43813 Vitreous degeneration, bilateral: Secondary | ICD-10-CM

## 2018-02-06 DIAGNOSIS — I10 Essential (primary) hypertension: Secondary | ICD-10-CM

## 2018-02-06 DIAGNOSIS — E11319 Type 2 diabetes mellitus with unspecified diabetic retinopathy without macular edema: Secondary | ICD-10-CM | POA: Diagnosis not present

## 2018-02-06 DIAGNOSIS — H35033 Hypertensive retinopathy, bilateral: Secondary | ICD-10-CM | POA: Diagnosis not present

## 2018-02-06 DIAGNOSIS — E113593 Type 2 diabetes mellitus with proliferative diabetic retinopathy without macular edema, bilateral: Secondary | ICD-10-CM

## 2018-08-14 ENCOUNTER — Encounter (INDEPENDENT_AMBULATORY_CARE_PROVIDER_SITE_OTHER): Payer: Medicare Other | Admitting: Ophthalmology

## 2018-08-15 ENCOUNTER — Encounter (INDEPENDENT_AMBULATORY_CARE_PROVIDER_SITE_OTHER): Payer: Medicare Other | Admitting: Ophthalmology

## 2018-08-15 DIAGNOSIS — H35033 Hypertensive retinopathy, bilateral: Secondary | ICD-10-CM

## 2018-08-15 DIAGNOSIS — I10 Essential (primary) hypertension: Secondary | ICD-10-CM

## 2018-08-15 DIAGNOSIS — E113513 Type 2 diabetes mellitus with proliferative diabetic retinopathy with macular edema, bilateral: Secondary | ICD-10-CM | POA: Diagnosis not present

## 2018-08-15 DIAGNOSIS — H43813 Vitreous degeneration, bilateral: Secondary | ICD-10-CM

## 2018-08-15 DIAGNOSIS — E11319 Type 2 diabetes mellitus with unspecified diabetic retinopathy without macular edema: Secondary | ICD-10-CM

## 2019-01-07 ENCOUNTER — Telehealth: Payer: Self-pay | Admitting: Cardiology

## 2019-01-20 NOTE — Telephone Encounter (Signed)
On 01/07/2019 I called patient to make appt but his wife said he does not need an appt.

## 2019-02-06 ENCOUNTER — Other Ambulatory Visit: Payer: Self-pay | Admitting: Gastroenterology

## 2019-02-06 DIAGNOSIS — R109 Unspecified abdominal pain: Secondary | ICD-10-CM

## 2019-02-10 ENCOUNTER — Other Ambulatory Visit: Payer: Self-pay | Admitting: Cardiology

## 2019-02-10 DIAGNOSIS — I714 Abdominal aortic aneurysm, without rupture, unspecified: Secondary | ICD-10-CM

## 2019-02-13 ENCOUNTER — Encounter (INDEPENDENT_AMBULATORY_CARE_PROVIDER_SITE_OTHER): Payer: Medicare Other | Admitting: Ophthalmology

## 2019-02-13 ENCOUNTER — Other Ambulatory Visit: Payer: Self-pay

## 2019-02-13 DIAGNOSIS — E113593 Type 2 diabetes mellitus with proliferative diabetic retinopathy without macular edema, bilateral: Secondary | ICD-10-CM | POA: Diagnosis not present

## 2019-02-13 DIAGNOSIS — I10 Essential (primary) hypertension: Secondary | ICD-10-CM

## 2019-02-13 DIAGNOSIS — E11319 Type 2 diabetes mellitus with unspecified diabetic retinopathy without macular edema: Secondary | ICD-10-CM

## 2019-02-13 DIAGNOSIS — H35033 Hypertensive retinopathy, bilateral: Secondary | ICD-10-CM

## 2019-02-13 DIAGNOSIS — H43813 Vitreous degeneration, bilateral: Secondary | ICD-10-CM

## 2019-02-14 ENCOUNTER — Other Ambulatory Visit: Payer: Self-pay | Admitting: Gastroenterology

## 2019-02-14 ENCOUNTER — Ambulatory Visit
Admission: RE | Admit: 2019-02-14 | Discharge: 2019-02-14 | Disposition: A | Discharge status: Home / Self Care | Payer: Medicare Other | Source: Ambulatory Visit | Attending: Gastroenterology | Admitting: Gastroenterology

## 2019-02-14 DIAGNOSIS — R109 Unspecified abdominal pain: Secondary | ICD-10-CM

## 2019-02-21 ENCOUNTER — Other Ambulatory Visit: Payer: Medicare Other

## 2019-05-05 ENCOUNTER — Ambulatory Visit (INDEPENDENT_AMBULATORY_CARE_PROVIDER_SITE_OTHER): Payer: Medicare Other

## 2019-05-05 ENCOUNTER — Other Ambulatory Visit: Payer: Self-pay

## 2019-05-05 DIAGNOSIS — I714 Abdominal aortic aneurysm, without rupture, unspecified: Secondary | ICD-10-CM

## 2019-05-12 ENCOUNTER — Ambulatory Visit: Payer: Medicare Other | Admitting: Cardiology

## 2019-05-14 ENCOUNTER — Ambulatory Visit (INDEPENDENT_AMBULATORY_CARE_PROVIDER_SITE_OTHER): Payer: Medicare Other | Admitting: Cardiology

## 2019-05-14 ENCOUNTER — Encounter: Payer: Self-pay | Admitting: Cardiology

## 2019-05-14 ENCOUNTER — Other Ambulatory Visit: Payer: Self-pay

## 2019-05-14 VITALS — BP 130/74 | HR 64 | Ht 74.0 in | Wt 197.2 lb

## 2019-05-14 DIAGNOSIS — I129 Hypertensive chronic kidney disease with stage 1 through stage 4 chronic kidney disease, or unspecified chronic kidney disease: Secondary | ICD-10-CM | POA: Diagnosis not present

## 2019-05-14 DIAGNOSIS — I951 Orthostatic hypotension: Secondary | ICD-10-CM | POA: Diagnosis not present

## 2019-05-14 DIAGNOSIS — N183 Chronic kidney disease, stage 3 (moderate): Secondary | ICD-10-CM

## 2019-05-14 DIAGNOSIS — E1143 Type 2 diabetes mellitus with diabetic autonomic (poly)neuropathy: Secondary | ICD-10-CM | POA: Diagnosis not present

## 2019-05-14 DIAGNOSIS — I714 Abdominal aortic aneurysm, without rupture, unspecified: Secondary | ICD-10-CM

## 2019-05-14 DIAGNOSIS — E1322 Other specified diabetes mellitus with diabetic chronic kidney disease: Secondary | ICD-10-CM

## 2019-05-14 DIAGNOSIS — E1365 Other specified diabetes mellitus with hyperglycemia: Secondary | ICD-10-CM

## 2019-05-14 DIAGNOSIS — R0989 Other specified symptoms and signs involving the circulatory and respiratory systems: Secondary | ICD-10-CM

## 2019-05-14 DIAGNOSIS — E1165 Type 2 diabetes mellitus with hyperglycemia: Secondary | ICD-10-CM

## 2019-05-14 DIAGNOSIS — I10 Essential (primary) hypertension: Secondary | ICD-10-CM

## 2019-05-14 DIAGNOSIS — E7800 Pure hypercholesterolemia, unspecified: Secondary | ICD-10-CM | POA: Insufficient documentation

## 2019-05-14 DIAGNOSIS — IMO0002 Reserved for concepts with insufficient information to code with codable children: Secondary | ICD-10-CM

## 2019-05-14 HISTORY — DX: Reserved for concepts with insufficient information to code with codable children: IMO0002

## 2019-05-14 HISTORY — DX: Other specified symptoms and signs involving the circulatory and respiratory systems: R09.89

## 2019-05-14 HISTORY — DX: Essential (primary) hypertension: I10

## 2019-05-14 HISTORY — DX: Type 2 diabetes mellitus with hyperglycemia: E11.65

## 2019-05-14 HISTORY — DX: Pure hypercholesterolemia, unspecified: E78.00

## 2019-05-14 NOTE — Progress Notes (Signed)
Primary Physician/Referring:  Caffie Wolfe, Karla, MD  Patient ID: Christopher CoderPaul E Heidinger Jr., male    DOB: [birth year], 78 y.o.   MRN: [id number]  Chief Complaint  Patient presents with  . Hypertension  . Follow-up    HPI: Christopher Coderaul E Mcparland Jr.  is a 78 y.o. male  with  hypertension, diabetes mellitus with autonomic neuropathy and orthostatic hypotension, small abdominal aortic aneurysm, hypertension and hyperlipidemia. He has had multiple episodes of syncope, unexplained in January 2018, September 2018, February 2019. No recurrence since then. Wife present. He remains asymptomatic today except for occasional dizziness.   Past Medical History:  Diagnosis Date  . Bruit of left carotid artery 05/14/2019  . HTN (hypertension) 05/14/2019  . Hyperlipidemia   . Hypertension   . Pure hypercholesterolemia 05/14/2019  . Uncontrolled diabetes mellitus with diabetic autonomic neuropathy (HCC) 05/14/2019    Past Surgical History:  Procedure Laterality Date  . HERNIA REPAIR      Social History   Socioeconomic History  . Marital status: Married    Spouse name: Not on file  . Number of children: 2  . Years of education: Not on file  . Highest education level: Not on file  Occupational History  . Not on file  Social Needs  . Financial resource strain: Not on file  . Food insecurity:    Worry: Not on file    Inability: Not on file  . Transportation needs:    Medical: Not on file    Non-medical: Not on file  Tobacco Use  . Smoking status: Former Smoker    Years: 5.00    Types: Cigarettes    Last attempt to quit: 1962    Years since quitting: 58.4  . Smokeless tobacco: Never Used  Substance and Sexual Activity  . Alcohol use: Yes    Alcohol/week: 0.0 standard drinks    Frequency: Never  . Drug use: Never  . Sexual activity: Not on file  Lifestyle  . Physical activity:    Days per week: Not on file    Minutes per session: Not on file  . Stress: Not on file  Relationships  . Social connections:     Talks on phone: Not on file    Gets together: Not on file    Attends religious service: Not on file    Active member of club or organization: Not on file    Attends meetings of clubs or organizations: Not on file    Relationship status: Not on file  . Intimate partner violence:    Fear of current or ex partner: Not on file    Emotionally abused: Not on file    Physically abused: Not on file    Forced sexual activity: Not on file  Other Topics Concern  . Not on file  Social History Narrative  . Not on file    Review of Systems  Constitution: Negative for chills, decreased appetite, malaise/fatigue and weight gain.  Cardiovascular: Negative for chest pain, dyspnea on exertion, leg swelling and syncope.  Endocrine: Negative for cold intolerance.  Hematologic/Lymphatic: Does not bruise/bleed easily.  Musculoskeletal: Negative for joint swelling.  Gastrointestinal: Negative for abdominal pain, anorexia, change in bowel habit, hematochezia and melena.  Neurological: Positive for dizziness (mild). Negative for headaches, light-headedness, loss of balance and seizures.  Psychiatric/Behavioral: Positive for memory loss. Negative for depression and substance abuse.  All other systems reviewed and are negative.     Objective  Blood pressure 130/74, pulse 64, height 6'  2" (1.88 m), weight 197 lb 3.2 oz (89.4 kg), SpO2 96 %. Body mass index is 25.32 kg/m.    Physical Exam  Constitutional: He appears well-developed and well-nourished. No distress.  HENT:  Head: Atraumatic.  Eyes: Conjunctivae are normal.  Neck: Neck supple. No JVD present. No thyromegaly present.  Cardiovascular: Normal rate, regular rhythm and normal heart sounds. Exam reveals no gallop.  No murmur heard. Pulses:      Carotid pulses are 2+ on the right side and 2+ on the left side.      Femoral pulses are 2+ on the right side and 2+ on the left side.      Popliteal pulses are 2+ on the right side and 2+ on the left  side.       Dorsalis pedis pulses are 1+ on the right side and 1+ on the left side.       Posterior tibial pulses are 2+ on the right side and 2+ on the left side.  Pulmonary/Chest: Effort normal and breath sounds normal.  Abdominal: Soft. Bowel sounds are normal.  Musculoskeletal: Normal range of motion.  Neurological: He is alert.  Skin: Skin is warm and dry.  Psychiatric: He has a normal mood and affect.   Radiology: No results found.  Laboratory examination:   CMP Latest Ref Rng & Units 01/11/2009  Glucose 70 - 99 mg/dL 098(J144(H)  BUN 6 - 23 mg/dL 19(J44(H)  Creatinine 0.4 - 1.5 mg/dL 4.78(G2.17(H)  Sodium 956135 - 213145 mEq/L 135  Potassium 3.5 - 5.1 mEq/L 3.9  Chloride 96 - 112 mEq/L 101  CO2 19 - 32 mEq/L 25  Calcium 8.4 - 10.5 mg/dL 9.0  Total Protein 6.0 - 8.3 g/dL 7.2  Total Bilirubin 0.3 - 1.2 mg/dL 0.5  Alkaline Phos 39 - 117 U/L 40  AST 0 - 37 U/L 24  ALT 0 - 53 U/L 19   CBC Latest Ref Rng & Units 01/11/2009  WBC 4.0 - 10.5 K/uL 8.5  Hemoglobin 13.0 - 17.0 g/dL 11.4(L)  Hematocrit 39.0 - 52.0 % 33.4(L)  Platelets 150 - 400 K/uL 200   Lipid Panel  No results found for: CHOL, TRIG, HDL, CHOLHDL, VLDL, LDLCALC, LDLDIRECT HEMOGLOBIN A1C No results found for: HGBA1C, MPG TSH No results for input(s): TSH in the last 8760 hours.  PRN Meds:. Medications Discontinued During This Encounter  Medication Reason  . COMBIGAN 0.2-0.5 % ophthalmic solution Error  . montelukast (SINGULAIR) 10 MG tablet Error  . MYRBETRIQ 50 MG TB24 tablet Error  . pindolol (VISKEN) 5 MG tablet Error  . TRAVATAN Z 0.004 % SOLN ophthalmic solution Error  . VESICARE 10 MG tablet Error  . losartan (COZAAR) 25 MG tablet Error  . hydrochlorothiazide (MICROZIDE) 12.5 MG capsule Error   Current Meds  Medication Sig  . amLODipine (NORVASC) 10 MG tablet Take 10 mg by mouth daily.   Marland Kitchen. aspirin EC 81 MG tablet Take 81 mg by mouth daily.  . Cholecalciferol (VITAMIN D-3) 25 MCG (1000 UT) CAPS Take by mouth daily.   . CRESTOR 10 MG tablet Take 10 mg by mouth daily.   Marland Kitchen. donepezil (ARICEPT) 10 MG tablet Take 20 mg by mouth at bedtime.  . dorzolamide (TRUSOPT) 2 % ophthalmic solution Place 1 drop into both eyes 2 (two) times daily.   . dorzolamide-timolol (COSOPT) 22.3-6.8 MG/ML ophthalmic solution   . glimepiride (AMARYL) 4 MG tablet Take 4 mg by mouth daily before breakfast.   . hydrALAZINE (APRESOLINE) 25 MG  tablet Take 25 mg by mouth 3 (three) times daily. Pt sometimes takes 2 a day  . LEVEMIR FLEXPEN 100 UNIT/ML SOPN 36 units Am, 26 PM  . Multiple Vitamin (MULTIVITAMIN WITH MINERALS) TABS tablet Take 1 tablet by mouth daily.  . Multiple Vitamins-Minerals (PRESERVISION AREDS 2 PO) Take by mouth 2 (two) times a day.  . omega-3 acid ethyl esters (LOVAZA) 1 g capsule Take by mouth 2 (two) times daily.  Marland Kitchen torsemide (DEMADEX) 20 MG tablet Take 20 mg by mouth daily.  . [DISCONTINUED] COMBIGAN 0.2-0.5 % ophthalmic solution Place 1 drop into both eyes every 12 (twelve) hours.   . [DISCONTINUED] hydrochlorothiazide (MICROZIDE) 12.5 MG capsule Take 12.5 mg by mouth 2 (two) times daily.   . [DISCONTINUED] losartan (COZAAR) 25 MG tablet Take 25 mg by mouth daily.     Cardiac Studies:    Abdominal Aortic Duplex  05/05/2019: Moderate dilatation of the abdominal aorta is noted in the distal aorta. An abdominal aortic aneurysm measuring 3.3 x 3.52 x 3.4 cm is seen. Diffuse calcific plaque noted in the proximal, mid and distal aorta.  no significant change since 06/28/2017.  Echocardiogram 10/05/2017: Left ventricle cavity is normal in size. Moderate concentric hypertrophy of the left ventricle. Normal global wall motion. Doppler evidence of grade II (pseudonormal) diastolic dysfunction. Diastolic dysfunction findings suggests elevated LA/LV end diastolic pressure. Calculated EF 61%. Left atrial cavity is moderately dilated at 4.6 cm. Mild (Grade I) mitral regurgitation. Trace tricuspid regurgitation. Unable to  estimate PA pressure due to absence/minimal TR signal.  Exercise myoview stress 10/01/2017:  1. Exercise tolerance was fair. Heart rate response was adequate. Blood pressure response was hypertensive. Patient reached 95% of target heart raate and achieved 6.4 METS. . Stress EKG Test Results Normal. Clinically negative stress test with no chest pain.  2. Normal SPECT study with normal left ventricular systolic function. Low risk study. Fixed perfusion defect due to diaphragmatic attenuation. Normal left ventricular systolic functio  ABI (lifeline screening 06/24/2015: Normal bilateral 1.03.  Carotid artery duplex 10/04/2017: Minimal stenosis in the biateral internal carotid artery (minimal). Antegrade right vertebral artery flow. Antegrade left vertebral artery flow. No significant change in 07/30/2014.  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 No evidence of ischemia.  Recommendations:   Patient remains stable from AAA surveillance, we can repeat abdominal aortic duplex in 2 years.  Except for uncontrolled diabetes, his risk factors are well taken care of including hypertension and hyperlipidemia.  Dizziness and orthostasis symptoms are improved since last office visit, he is also aware to get up slowly.  He has not had any recent fall.  Ordered cognition and mild dementia continues to be a problem here his wife is present in all questions answered.  I'll see him back in 2 years as his  remains stable from cardiac standpoint unless he develops any new cardiac symptoms we will see sooner.  Adrian Prows, MD, [facility] 05/14/2019, 9:21 PM [facility] [place] Pager: [phone] Office: [phone] If no answer Cell [phone]

## 2019-08-18 ENCOUNTER — Encounter (INDEPENDENT_AMBULATORY_CARE_PROVIDER_SITE_OTHER): Payer: Medicare Other | Admitting: Ophthalmology

## 2019-08-18 ENCOUNTER — Other Ambulatory Visit: Payer: Self-pay

## 2019-08-18 DIAGNOSIS — H353121 Nonexudative age-related macular degeneration, left eye, early dry stage: Secondary | ICD-10-CM

## 2019-08-18 DIAGNOSIS — I10 Essential (primary) hypertension: Secondary | ICD-10-CM

## 2019-08-18 DIAGNOSIS — E11319 Type 2 diabetes mellitus with unspecified diabetic retinopathy without macular edema: Secondary | ICD-10-CM

## 2019-08-18 DIAGNOSIS — E113593 Type 2 diabetes mellitus with proliferative diabetic retinopathy without macular edema, bilateral: Secondary | ICD-10-CM

## 2019-08-18 DIAGNOSIS — H35033 Hypertensive retinopathy, bilateral: Secondary | ICD-10-CM

## 2019-08-18 DIAGNOSIS — H43813 Vitreous degeneration, bilateral: Secondary | ICD-10-CM

## 2020-02-16 ENCOUNTER — Encounter (INDEPENDENT_AMBULATORY_CARE_PROVIDER_SITE_OTHER): Payer: Medicare Other | Admitting: Ophthalmology

## 2020-02-16 DIAGNOSIS — E11319 Type 2 diabetes mellitus with unspecified diabetic retinopathy without macular edema: Secondary | ICD-10-CM | POA: Diagnosis not present

## 2020-02-16 DIAGNOSIS — H43813 Vitreous degeneration, bilateral: Secondary | ICD-10-CM

## 2020-02-16 DIAGNOSIS — E113593 Type 2 diabetes mellitus with proliferative diabetic retinopathy without macular edema, bilateral: Secondary | ICD-10-CM

## 2020-02-16 DIAGNOSIS — I10 Essential (primary) hypertension: Secondary | ICD-10-CM

## 2020-02-16 DIAGNOSIS — H35033 Hypertensive retinopathy, bilateral: Secondary | ICD-10-CM

## 2020-08-18 ENCOUNTER — Encounter (INDEPENDENT_AMBULATORY_CARE_PROVIDER_SITE_OTHER): Payer: Medicare Other | Admitting: Ophthalmology

## 2020-08-26 ENCOUNTER — Other Ambulatory Visit: Payer: Self-pay

## 2020-08-26 ENCOUNTER — Encounter (INDEPENDENT_AMBULATORY_CARE_PROVIDER_SITE_OTHER): Payer: Medicare Other | Admitting: Ophthalmology

## 2020-08-26 DIAGNOSIS — I10 Essential (primary) hypertension: Secondary | ICD-10-CM | POA: Diagnosis not present

## 2020-08-26 DIAGNOSIS — H43813 Vitreous degeneration, bilateral: Secondary | ICD-10-CM

## 2020-08-26 DIAGNOSIS — E113593 Type 2 diabetes mellitus with proliferative diabetic retinopathy without macular edema, bilateral: Secondary | ICD-10-CM

## 2020-08-26 DIAGNOSIS — E11319 Type 2 diabetes mellitus with unspecified diabetic retinopathy without macular edema: Secondary | ICD-10-CM

## 2020-08-26 DIAGNOSIS — H35033 Hypertensive retinopathy, bilateral: Secondary | ICD-10-CM

## 2020-08-30 ENCOUNTER — Encounter (INDEPENDENT_AMBULATORY_CARE_PROVIDER_SITE_OTHER): Payer: Medicare Other | Admitting: Ophthalmology

## 2020-10-28 IMAGING — CT CT ABDOMEN AND PELVIS WITHOUT CONTRAST
2 of 4 series · 13 of 46 positions shown, 15 images · non-contrast
Comparison: 03/01/2018

CLINICAL DATA: Right-sided abdominal pain. Status post
prostatectomy for cancer.

EXAM:
CT ABDOMEN AND PELVIS WITHOUT CONTRAST
TECHNIQUE: Multidetector CT imaging of the abdomen and pelvis was performed
following the standard protocol without IV contrast.

[Series 2: routine abdomen pelvis without 5.00 br40 s3 ax · axial · non-contrast · 0.64mm/px · z∈[+1407,+1842]mm · 10 of 103 slices shown, 12 images]
[im 8/103  soft-tissue]
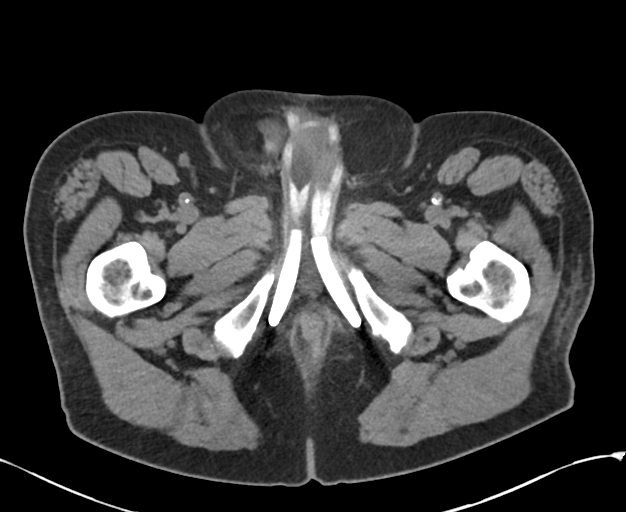
[im 8/103  bone]
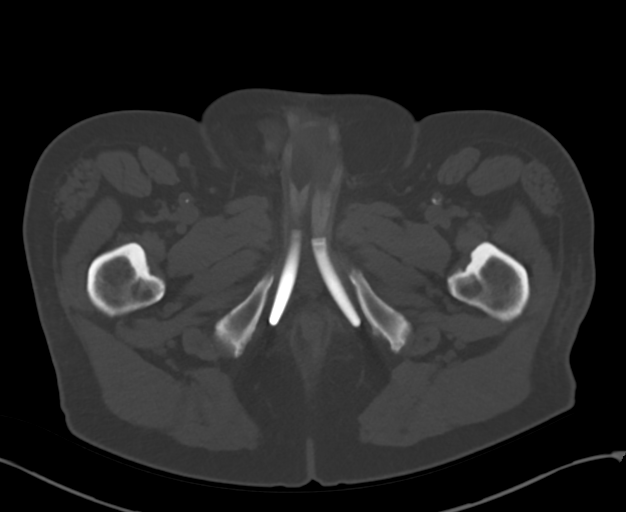
[im 16/103  soft-tissue]
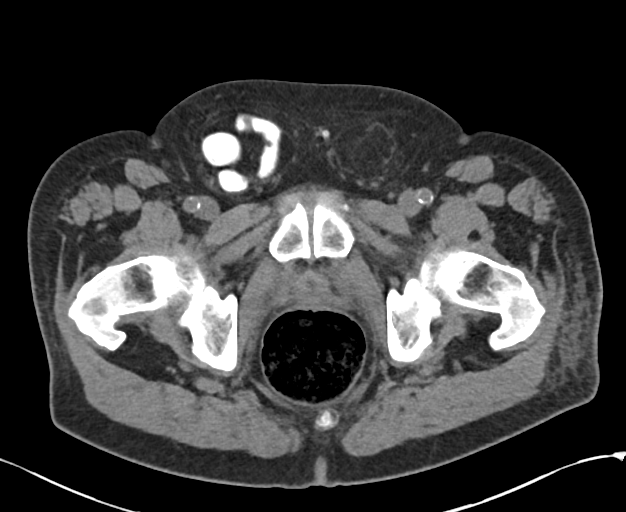
[im 28/103  soft-tissue]
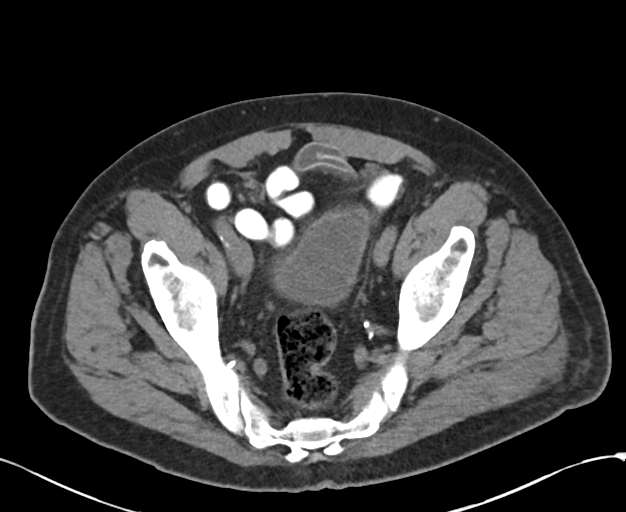
[im 36/103  soft-tissue]
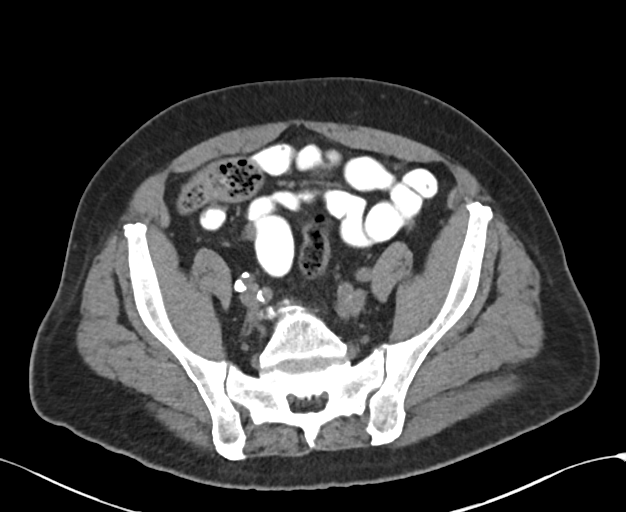
[im 48/103  soft-tissue]
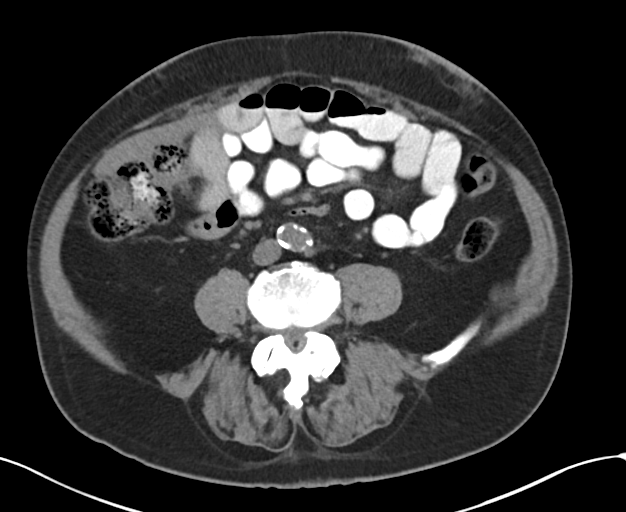
[im 55/103  soft-tissue]
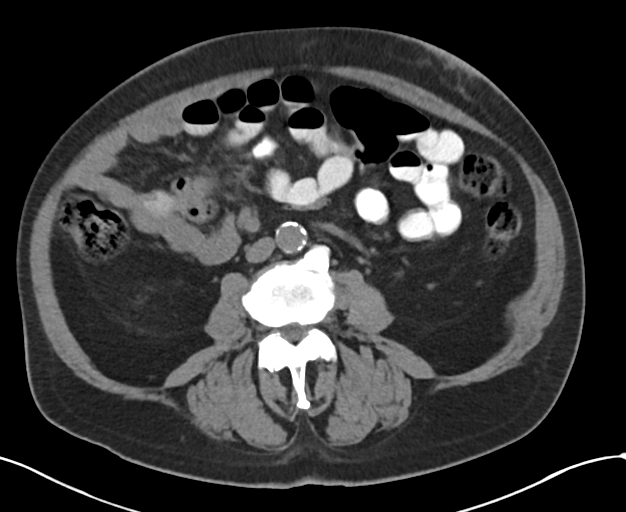
[im 67/103  soft-tissue]
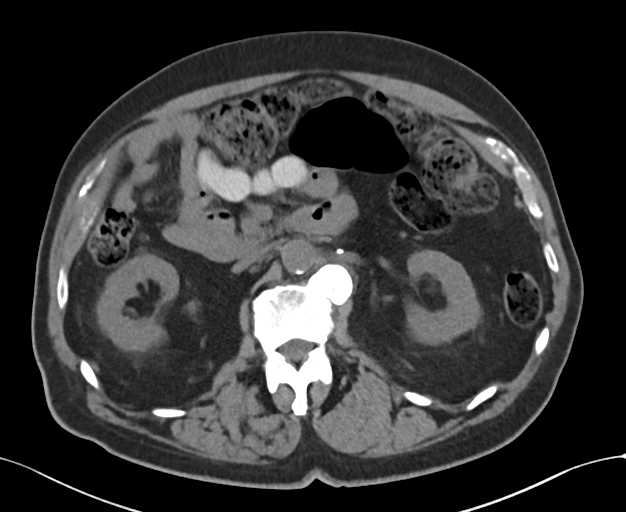
[im 75/103  soft-tissue]
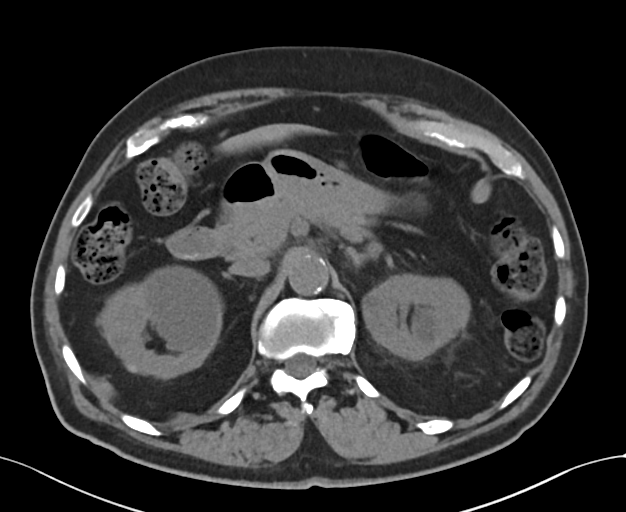
[im 87/103  soft-tissue]
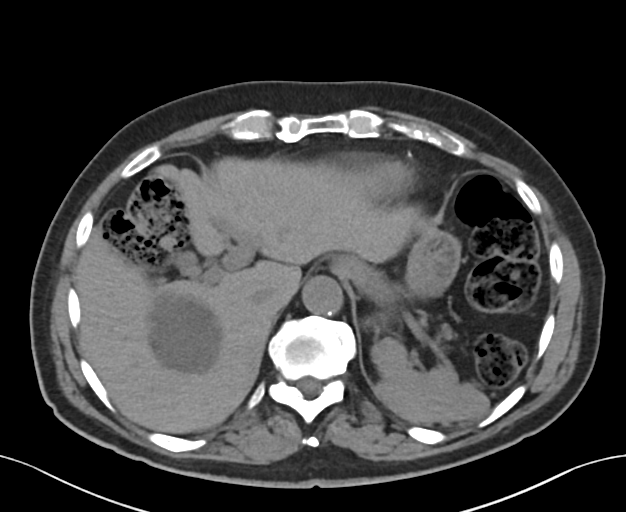
[im 87/103  bone]
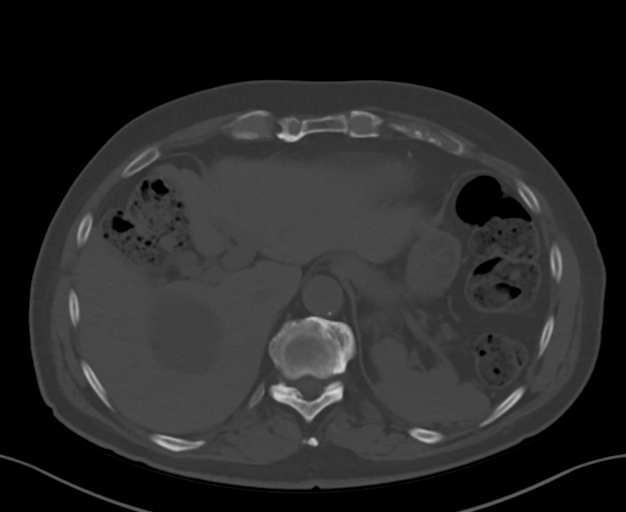
[im 95/103  soft-tissue]
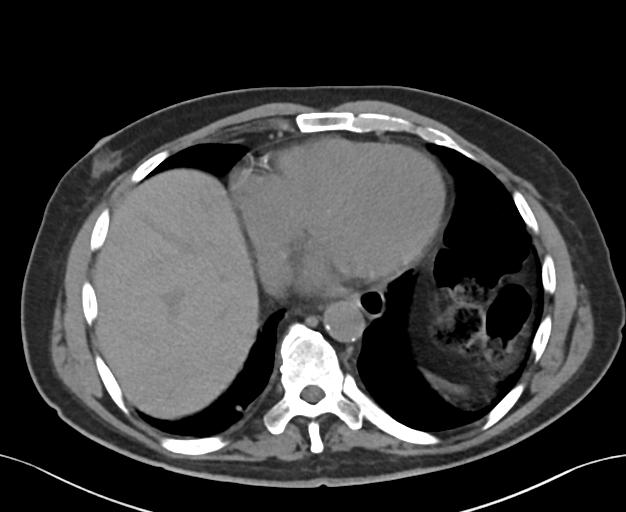

[Series 4: routine abdomen pelvis without 2.00 br40 s3 cor · coronal · non-contrast · 0.78mm/px · 3 of 163 slices shown]
[im 55/163  soft-tissue]
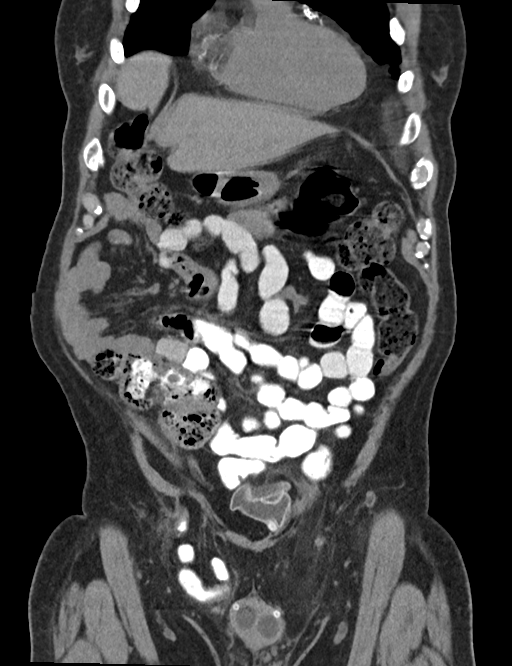
[im 73/163  soft-tissue]
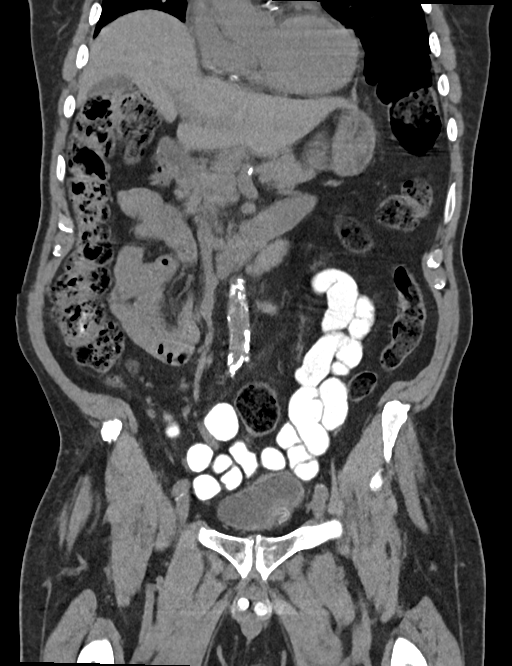
[im 91/163  soft-tissue]
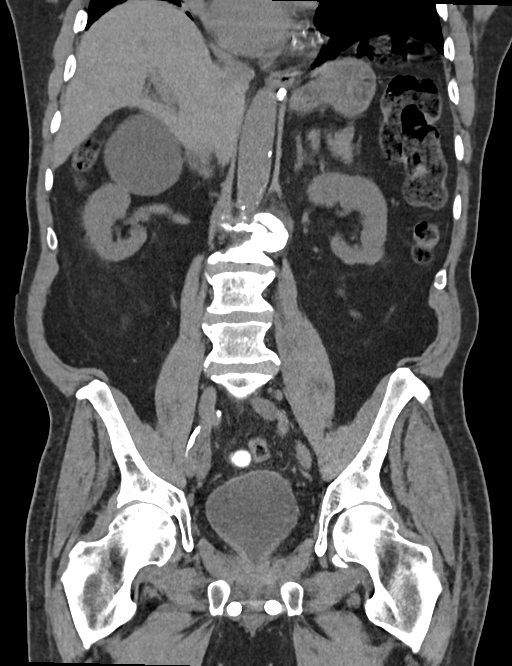

[13 of 46 positions shown; findings below may reference images not displayed]

FINDINGS: Lower chest: Atelectasis noted left lung base. Coronary artery
calcification is evident.

Hepatobiliary: No focal abnormality in the liver on this study
without intravenous contrast. There is no evidence for gallstones,
gallbladder wall thickening, or pericholecystic fluid. No
intrahepatic or extrahepatic biliary dilation.

Pancreas: No focal mass lesion. No dilatation of the main duct. No
intraparenchymal cyst. No peripancreatic edema.

Spleen: No splenomegaly. No focal mass lesion.

Adrenals/Urinary Tract: No adrenal nodule or mass. 7.4 cm
well-defined homogeneous low-density lesion in the upper pole right
kidney is similar to prior and compatible with a cyst. 17 mm
low-density lesion posterior interpolar left kidney is stable in the
interval and also likely a cyst. No change in the 2.5 cm probable
cyst upper pole left kidney. Other smaller low-density lesions in
each kidney are too small to characterize. No hydronephrosis. No
evidence for hydroureter. The urinary bladder appears normal for the
degree of distention.

Stomach/Bowel: Stomach is unremarkable. No gastric wall thickening.
No evidence of outlet obstruction. Duodenum is normally positioned
as is the ligament of Treitz. No small bowel wall thickening. No
small bowel dilatation. The terminal ileum is normal. The appendix
is normal. No gross colonic mass. No colonic wall thickening.

Vascular/Lymphatic: There is abdominal aortic atherosclerosis
without aneurysm. There is no gastrohepatic or hepatoduodenal
ligament lymphadenopathy. No intraperitoneal or retroperitoneal
lymphadenopathy. No pelvic sidewall lymphadenopathy.

Reproductive: Prostate gland surgically absent. Penile prosthetic
device evident.

Other: Soft tissue attenuation in the subcutaneous left anterior
abdominal wall is stable in the interval and may reflect scar.

Musculoskeletal: No worrisome lytic or sclerotic osseous
abnormality.
IMPRESSION: 1. No acute findings in the abdomen or pelvis. No specific findings
to explain the patient's history of right-sided abdominal pain.
2. Bilateral groin hernias. Right groin hernia contains a segment of
distal ileum without small bowel wall thickening or evidence of
obstruction. Tiny amount of fluid is evident in both hernias.
3. Status post prostatectomy.
4.  Aortic Atherosclerois ([id number]-170.0)

## 2021-02-23 ENCOUNTER — Encounter (INDEPENDENT_AMBULATORY_CARE_PROVIDER_SITE_OTHER): Payer: Medicare Other | Admitting: Ophthalmology

## 2021-03-18 ENCOUNTER — Encounter (INDEPENDENT_AMBULATORY_CARE_PROVIDER_SITE_OTHER): Payer: Medicare Other | Admitting: Ophthalmology

## 2021-04-26 ENCOUNTER — Encounter (INDEPENDENT_AMBULATORY_CARE_PROVIDER_SITE_OTHER): Payer: Medicare Other | Admitting: Ophthalmology

## 2021-08-09 ENCOUNTER — Encounter (INDEPENDENT_AMBULATORY_CARE_PROVIDER_SITE_OTHER): Payer: Medicare Other | Admitting: Ophthalmology

## 2021-08-09 ENCOUNTER — Other Ambulatory Visit: Payer: Self-pay

## 2021-08-09 DIAGNOSIS — H35033 Hypertensive retinopathy, bilateral: Secondary | ICD-10-CM

## 2021-08-09 DIAGNOSIS — I10 Essential (primary) hypertension: Secondary | ICD-10-CM | POA: Diagnosis not present

## 2021-08-09 DIAGNOSIS — E113512 Type 2 diabetes mellitus with proliferative diabetic retinopathy with macular edema, left eye: Secondary | ICD-10-CM

## 2021-08-09 DIAGNOSIS — H43813 Vitreous degeneration, bilateral: Secondary | ICD-10-CM

## 2021-08-09 DIAGNOSIS — E113591 Type 2 diabetes mellitus with proliferative diabetic retinopathy without macular edema, right eye: Secondary | ICD-10-CM | POA: Diagnosis not present

## 2022-02-06 ENCOUNTER — Encounter (INDEPENDENT_AMBULATORY_CARE_PROVIDER_SITE_OTHER): Payer: Medicare Other | Admitting: Ophthalmology

## 2022-02-14 ENCOUNTER — Encounter (INDEPENDENT_AMBULATORY_CARE_PROVIDER_SITE_OTHER): Payer: Medicare Other | Admitting: Ophthalmology

## 2022-02-23 ENCOUNTER — Other Ambulatory Visit: Payer: Self-pay

## 2022-02-23 ENCOUNTER — Encounter (INDEPENDENT_AMBULATORY_CARE_PROVIDER_SITE_OTHER): Payer: No Typology Code available for payment source | Admitting: Ophthalmology

## 2022-02-23 DIAGNOSIS — E113593 Type 2 diabetes mellitus with proliferative diabetic retinopathy without macular edema, bilateral: Secondary | ICD-10-CM | POA: Diagnosis not present

## 2022-02-23 DIAGNOSIS — I10 Essential (primary) hypertension: Secondary | ICD-10-CM | POA: Diagnosis not present

## 2022-02-23 DIAGNOSIS — H35033 Hypertensive retinopathy, bilateral: Secondary | ICD-10-CM

## 2022-02-23 DIAGNOSIS — H43813 Vitreous degeneration, bilateral: Secondary | ICD-10-CM | POA: Diagnosis not present

## 2023-02-26 ENCOUNTER — Encounter (INDEPENDENT_AMBULATORY_CARE_PROVIDER_SITE_OTHER): Payer: Medicare Other | Admitting: Ophthalmology

## 2023-03-05 DEATH — deceased
